# Patient Record
Sex: Male | Born: 1955 | Race: Black or African American | Hispanic: No | Marital: Single | Smoking: Current every day smoker
Health system: Southern US, Community
[De-identification: ages and names within clinical notes are randomized; demographics above are authoritative.]

## PROBLEM LIST (undated history)

## (undated) DIAGNOSIS — G8929 Other chronic pain: Secondary | ICD-10-CM

## (undated) DIAGNOSIS — F4310 Post-traumatic stress disorder, unspecified: Secondary | ICD-10-CM

## (undated) DIAGNOSIS — I10 Essential (primary) hypertension: Secondary | ICD-10-CM

---

## 2020-04-30 ENCOUNTER — Encounter (HOSPITAL_COMMUNITY): Payer: Self-pay

## 2020-04-30 ENCOUNTER — Other Ambulatory Visit: Payer: Self-pay

## 2020-04-30 ENCOUNTER — Emergency Department (HOSPITAL_COMMUNITY): Payer: Medicare Other

## 2020-04-30 ENCOUNTER — Emergency Department (HOSPITAL_COMMUNITY)
Admission: EM | Admit: 2020-04-30 | Discharge: 2020-04-30 | Disposition: A | Discharge status: Home / Self Care | Payer: Medicare Other | Attending: Emergency Medicine | Admitting: Emergency Medicine

## 2020-04-30 DIAGNOSIS — Y92410 Unspecified street and highway as the place of occurrence of the external cause: Secondary | ICD-10-CM | POA: Insufficient documentation

## 2020-04-30 DIAGNOSIS — M542 Cervicalgia: Secondary | ICD-10-CM | POA: Insufficient documentation

## 2020-04-30 DIAGNOSIS — I10 Essential (primary) hypertension: Secondary | ICD-10-CM | POA: Insufficient documentation

## 2020-04-30 DIAGNOSIS — Y9389 Activity, other specified: Secondary | ICD-10-CM | POA: Insufficient documentation

## 2020-04-30 DIAGNOSIS — R519 Headache, unspecified: Secondary | ICD-10-CM | POA: Insufficient documentation

## 2020-04-30 DIAGNOSIS — M549 Dorsalgia, unspecified: Secondary | ICD-10-CM | POA: Diagnosis not present

## 2020-04-30 DIAGNOSIS — F17200 Nicotine dependence, unspecified, uncomplicated: Secondary | ICD-10-CM | POA: Insufficient documentation

## 2020-04-30 DIAGNOSIS — Y999 Unspecified external cause status: Secondary | ICD-10-CM | POA: Insufficient documentation

## 2020-04-30 HISTORY — DX: Other chronic pain: G89.29

## 2020-04-30 HISTORY — DX: Post-traumatic stress disorder, unspecified: F43.10

## 2020-04-30 HISTORY — DX: Essential (primary) hypertension: I10

## 2020-04-30 NOTE — ED Notes (Signed)
Patient verbalizes understanding of discharge instructions . Opportunity for questions and answers were provided . Armband removed by staff ,Pt discharged from ED. W/C  offered at D/C  and Declined W/C at D/C and was escorted to lobby by RN.  
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
## 2020-04-30 NOTE — Discharge Instructions (Signed)
Please return for any problem.  Follow-up with your regular care provider as instructed.  Use ice, Tylenol, and ibuprofen as suggested for pain control.
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
## 2020-04-30 NOTE — ED Notes (Signed)
Pt transported to xray 

## 2020-05-02 ENCOUNTER — Encounter (HOSPITAL_COMMUNITY): Payer: Self-pay

## 2020-05-02 ENCOUNTER — Encounter (HOSPITAL_COMMUNITY): Payer: Self-pay | Admitting: Emergency Medicine

## 2020-05-02 ENCOUNTER — Emergency Department (HOSPITAL_COMMUNITY): Payer: Medicare Other

## 2020-05-02 ENCOUNTER — Emergency Department (HOSPITAL_COMMUNITY)
Admission: EM | Admit: 2020-05-02 | Discharge: 2020-05-02 | Disposition: A | Discharge status: Home / Self Care | Payer: Medicare Other | Attending: Emergency Medicine | Admitting: Emergency Medicine

## 2020-05-02 ENCOUNTER — Emergency Department (HOSPITAL_COMMUNITY)
Admission: EM | Admit: 2020-05-02 | Discharge: 2020-05-03 | Discharge status: Against Medical Advice | Payer: Medicare Other | Source: Home / Self Care | Attending: Emergency Medicine | Admitting: Emergency Medicine

## 2020-05-02 ENCOUNTER — Other Ambulatory Visit: Payer: Self-pay

## 2020-05-02 DIAGNOSIS — R440 Auditory hallucinations: Secondary | ICD-10-CM | POA: Insufficient documentation

## 2020-05-02 DIAGNOSIS — Z20822 Contact with and (suspected) exposure to covid-19: Secondary | ICD-10-CM | POA: Insufficient documentation

## 2020-05-02 DIAGNOSIS — R4182 Altered mental status, unspecified: Secondary | ICD-10-CM | POA: Diagnosis present

## 2020-05-02 DIAGNOSIS — R451 Restlessness and agitation: Secondary | ICD-10-CM

## 2020-05-02 DIAGNOSIS — F17200 Nicotine dependence, unspecified, uncomplicated: Secondary | ICD-10-CM | POA: Insufficient documentation

## 2020-05-02 DIAGNOSIS — F1910 Other psychoactive substance abuse, uncomplicated: Secondary | ICD-10-CM | POA: Insufficient documentation

## 2020-05-02 DIAGNOSIS — F17210 Nicotine dependence, cigarettes, uncomplicated: Secondary | ICD-10-CM | POA: Insufficient documentation

## 2020-05-02 DIAGNOSIS — I10 Essential (primary) hypertension: Secondary | ICD-10-CM | POA: Insufficient documentation

## 2020-05-02 LAB — COMPREHENSIVE METABOLIC PANEL
ALT: 30 U/L (ref 0–44)
AST: 26 U/L (ref 15–41)
Albumin: 4.6 g/dL (ref 3.5–5.0)
Alkaline Phosphatase: 91 U/L (ref 38–126)
Anion gap: 11 (ref 5–15)
BUN: 21 mg/dL (ref 8–23)
CO2: 25 mmol/L (ref 22–32)
Calcium: 9.5 mg/dL (ref 8.9–10.3)
Chloride: 105 mmol/L (ref 98–111)
Creatinine, Ser: 1.41 mg/dL — ABNORMAL HIGH (ref 0.61–1.24)
GFR calc Af Amer: >60 mL/min (ref >60)
GFR calc non Af Amer: 53 mL/min — ABNORMAL LOW (ref >60)
Glucose, Bld: 103 mg/dL — ABNORMAL HIGH (ref 70–99)
Potassium: 3.4 mmol/L — ABNORMAL LOW (ref 3.5–5.1)
Sodium: 141 mmol/L (ref 135–145)
Total Bilirubin: 1 mg/dL (ref 0.3–1.2)
Total Protein: 9 g/dL — ABNORMAL HIGH (ref 6.5–8.1)

## 2020-05-02 LAB — RAPID URINE DRUG SCREEN, HOSP PERFORMED
Amphetamines: POSITIVE — AB
Barbiturates: NOT DETECTED
Benzodiazepines: NOT DETECTED
Cocaine: POSITIVE — AB
Opiates: NOT DETECTED
Tetrahydrocannabinol: POSITIVE — AB

## 2020-05-02 LAB — CBC
HCT: 41.2 % (ref 39.0–52.0)
Hemoglobin: 13.1 g/dL (ref 13.0–17.0)
MCH: 34.2 pg — ABNORMAL HIGH (ref 26.0–34.0)
MCHC: 31.8 g/dL (ref 30.0–36.0)
MCV: 107.6 fL — ABNORMAL HIGH (ref 80.0–100.0)
Platelets: 233 10*3/uL (ref 150–400)
RBC: 3.83 MIL/uL — ABNORMAL LOW (ref 4.22–5.81)
RDW: 13.1 % (ref 11.5–15.5)
WBC: 10.4 10*3/uL (ref 4.0–10.5)
nRBC: 0 % (ref 0.0–0.2)

## 2020-05-02 LAB — SARS CORONAVIRUS 2 BY RT PCR (HOSPITAL ORDER, PERFORMED IN ~~LOC~~ HOSPITAL LAB): SARS Coronavirus 2: NEGATIVE

## 2020-05-02 LAB — ETHANOL: Alcohol, Ethyl (B): <10 mg/dL (ref <10)

## 2020-05-02 MED ORDER — ONDANSETRON HCL 4 MG PO TABS: 4.0000 mg | ORAL_TABLET | Freq: Three times a day (TID) | ORAL | Status: DC | PRN

## 2020-05-02 MED ORDER — ACETAMINOPHEN 325 MG PO TABS: 650.0000 mg | ORAL_TABLET | ORAL | Status: DC | PRN

## 2020-05-02 MED ORDER — HYDROXYZINE HCL 25 MG PO TABS
50.0000 mg | ORAL_TABLET | Freq: Once | ORAL | Status: AC
  Administered 2020-05-02: 50 mg via ORAL
  Filled 2020-05-02: qty 2

## 2020-05-02 MED ORDER — SODIUM CHLORIDE 0.9 % IV BOLUS (SEPSIS)
1000.0000 mL | Freq: Once | INTRAVENOUS | Status: AC
  Administered 2020-05-02: 1000 mL via INTRAVENOUS

## 2020-05-02 MED ORDER — BENZTROPINE MESYLATE 1 MG PO TABS: 1.0000 mg | ORAL_TABLET | Freq: Four times a day (QID) | ORAL | Status: DC | PRN

## 2020-05-02 MED ORDER — SODIUM CHLORIDE 0.9 % IV SOLN
1000.0000 mL | INTRAVENOUS | Status: DC
  Administered 2020-05-02: 1000 mL via INTRAVENOUS

## 2020-05-02 MED ORDER — HALOPERIDOL 5 MG PO TABS: 5.0000 mg | ORAL_TABLET | Freq: Four times a day (QID) | ORAL | Status: DC | PRN
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
## 2020-05-02 NOTE — ED Triage Notes (Signed)
Patient arrived via [facility] but would not answer any questions. Upon arrival, patient states he is here because he does not have his medication and has had a panic attack. Patient cooperative in triage. Patient declines any pain or shortness of breath.
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
## 2020-05-02 NOTE — Discharge Instructions (Addendum)
Please do not use illicit substances including cocaine, thc, or amphetamines.  Please have your blood pressure rechecked this week.

## 2020-05-02 NOTE — ED Provider Notes (Signed)
  Physical Exam  BP (!) 146/78 (BP Location: Right Arm)   Pulse 94   Temp 98 F (36.7 C) (Oral)   Resp 19   SpO2 100%   Physical Exam  ED Course/Procedures   Clinical Course as of May 02 2310  Tue May 02, 2020  2304 Patient stable, medically cleared and continues to wait for tts   [KM]    Clinical Course User Index [KM] Arlyn Dunning, PA-C    Procedures  MDM  Care assumed from Deforest Hoyles PA due ot change of shift, see his notes for full HPI     Jeral Pinch 05/02/20 2311    Vanetta Mulders, MD 05/02/20 2314

## 2020-05-02 NOTE — ED Triage Notes (Signed)
Patient here from lobby reporting that he is not speaking. States that he told the registration what he was here for and he is not repeating it. Seen earlier today.

## 2020-05-02 NOTE — ED Notes (Signed)
Culture sent with UA 

## 2020-05-03 NOTE — ED Notes (Signed)
Pt was unable to be found in room, or anywhere in the department, per security he was seen leaving the ED about 2200pm

## 2022-06-13 IMAGING — CT CT CERVICAL SPINE W/O CM
3 of 4 series · 13 of 35 positions shown, 16 images · non-contrast
Comparison: None.

CLINICAL DATA: Status post trauma.

EXAM:
CT CERVICAL SPINE WITHOUT CONTRAST
TECHNIQUE: Multidetector CT imaging of the cervical spine was performed without
intravenous contrast. Multiplanar CT image reconstructions were also
generated.

[Series 4: c_spine 2.0 st · axial · 0.31mm/px · z∈[-255,-121]mm · 5 of 101 slices shown, 7 images]
[im 17/101  soft-tissue]
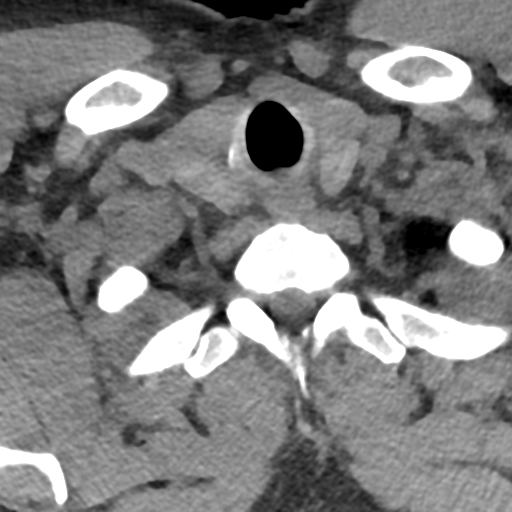
[im 17/101  bone]
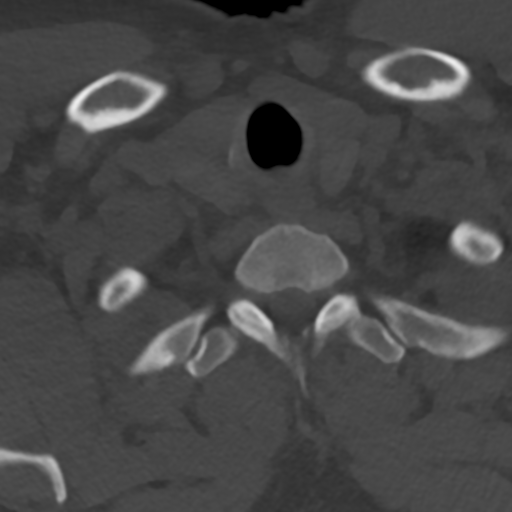
[im 34/101  bone]
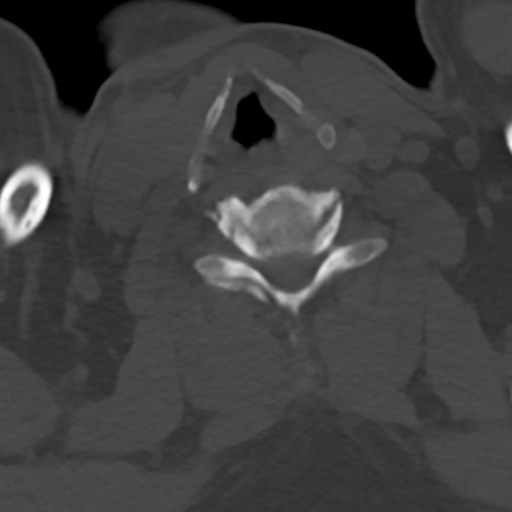
[im 51/101  bone]
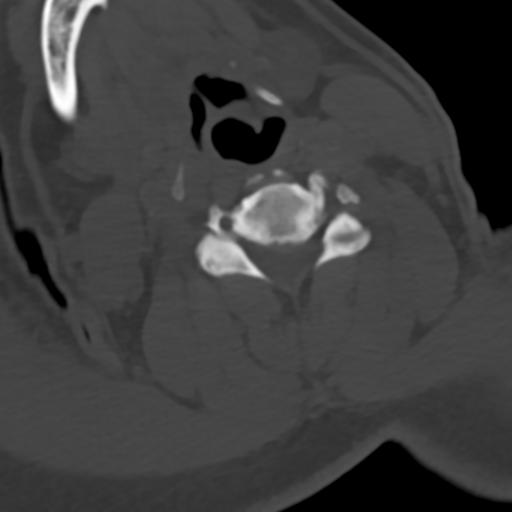
[im 67/101  bone]
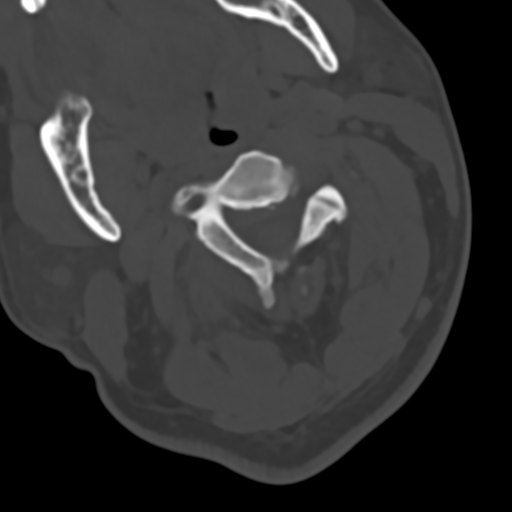
[im 84/101  soft-tissue]
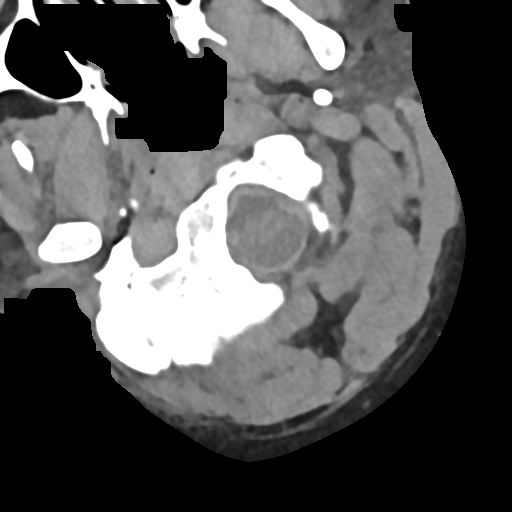
[im 84/101  bone]
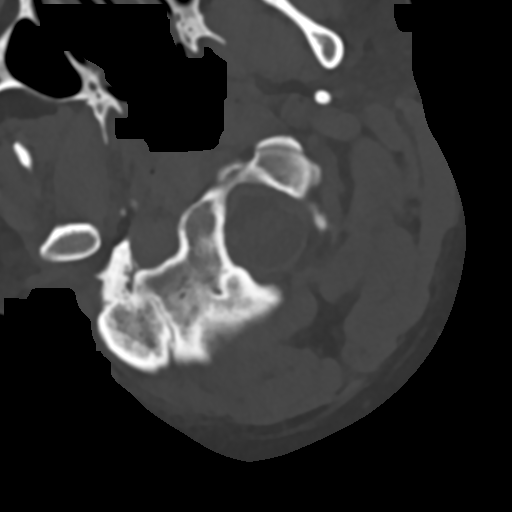

[Series 6: c_spine 2.0 sag bone · sagittal · 0.29mm/px · 5 of 61 slices shown, 6 images]
[im 21/61  bone]
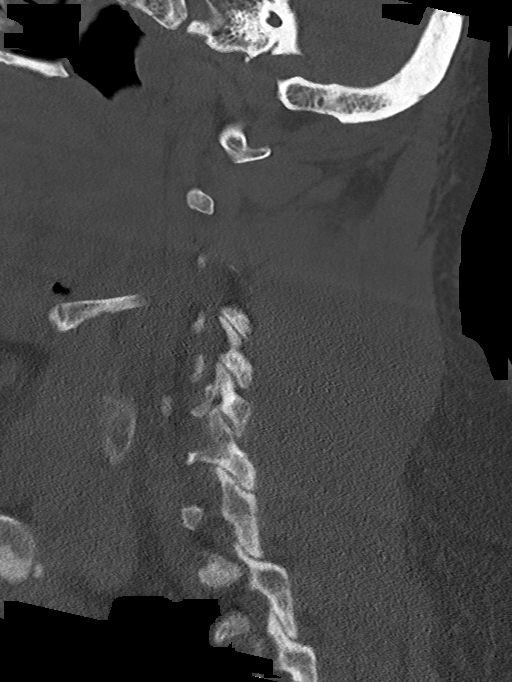
[im 26/61  bone]
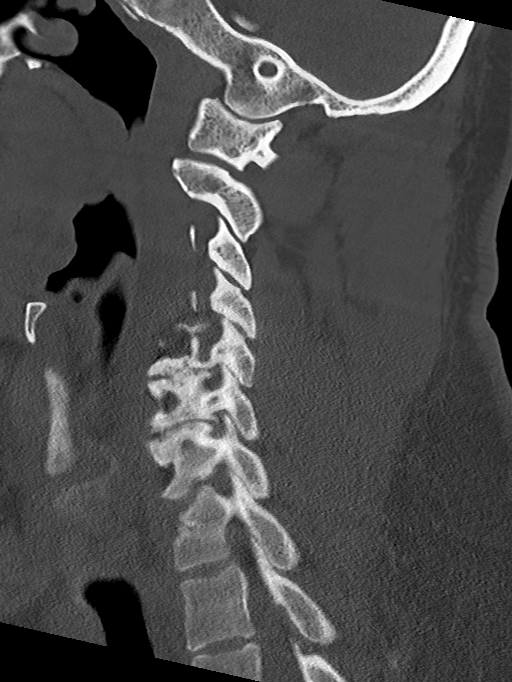
[im 31/61  soft-tissue]
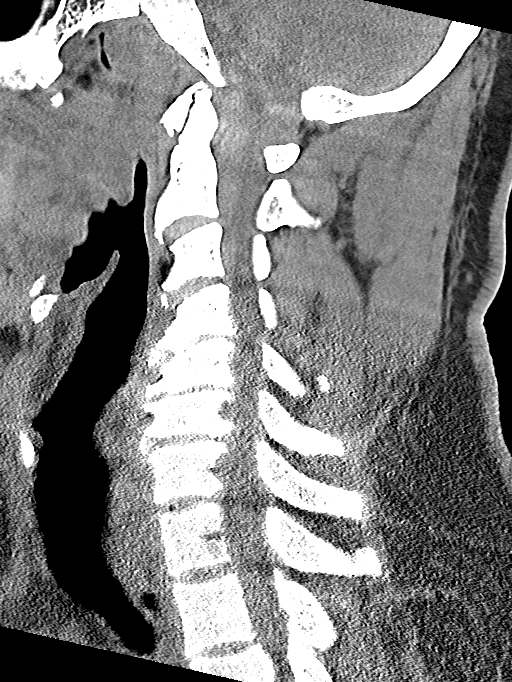
[im 31/61  bone]
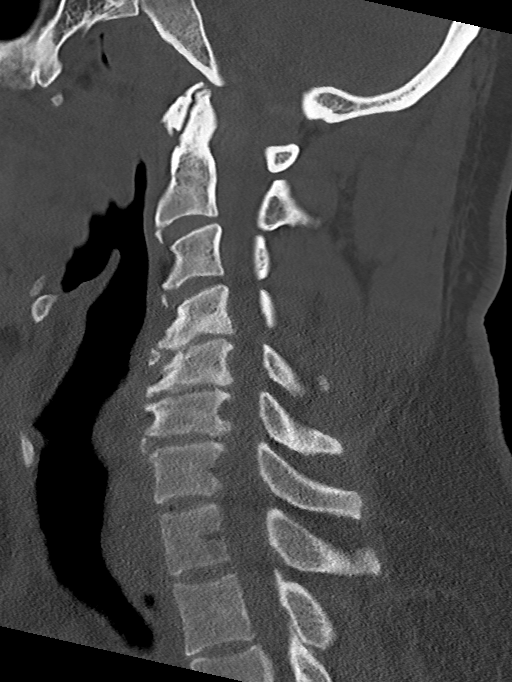
[im 36/61  bone]
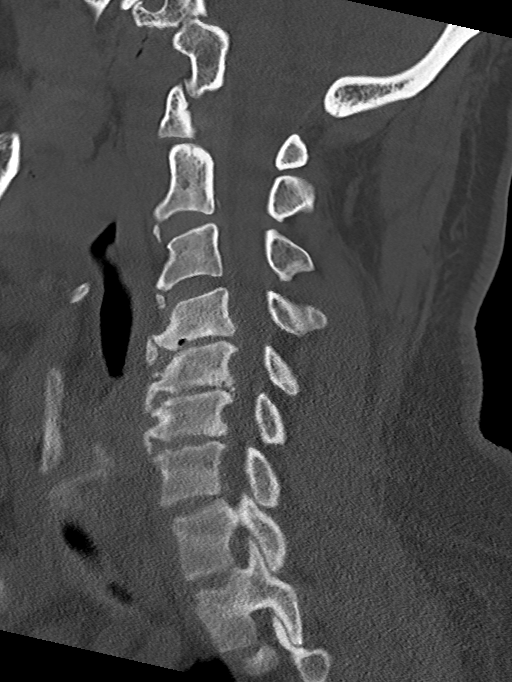
[im 41/61  bone]
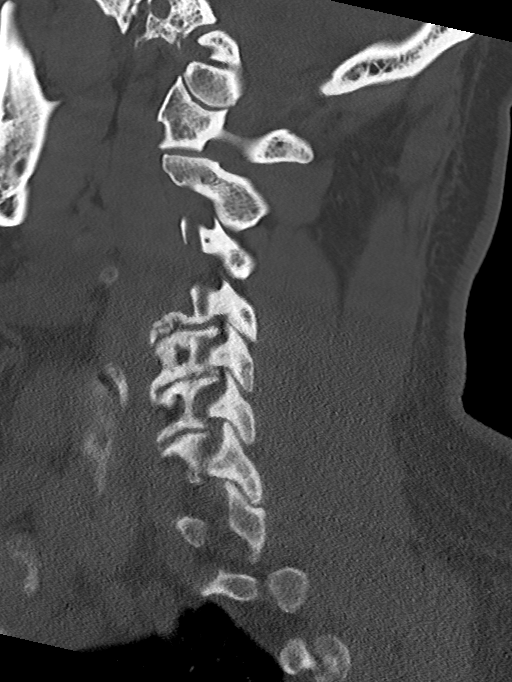

[Series 8: c_spine 2.0 cor bone · coronal · 0.29mm/px · 3 of 60 slices shown]
[im 12/60  bone]
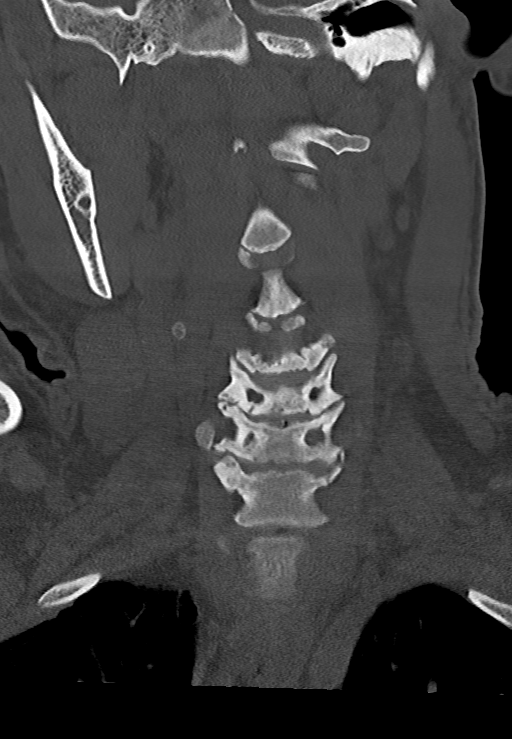
[im 24/60  bone]
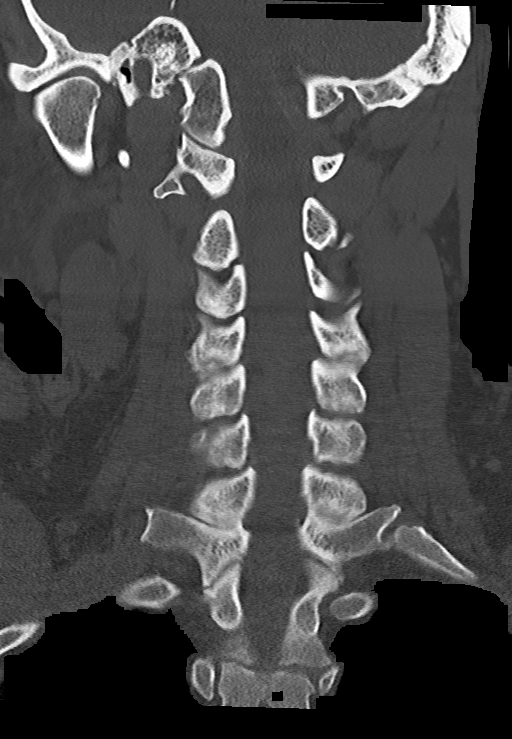
[im 36/60  bone]
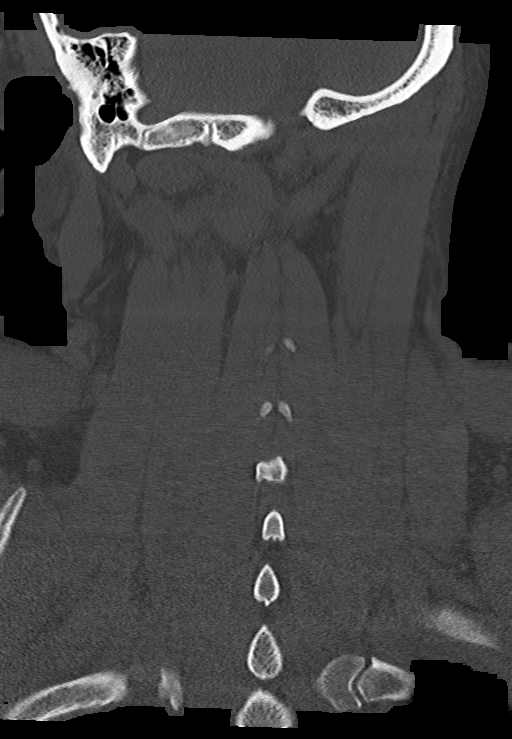

[13 of 35 positions shown; findings below may reference images not displayed]

FINDINGS: Alignment: Normal.

Skull base and vertebrae: No acute fracture. No primary bone lesion
or focal pathologic process.

Soft tissues and spinal canal: No prevertebral fluid or swelling. No
visible canal hematoma.

Disc levels: Moderate to marked severity endplate sclerosis is seen
at the levels of C4-C5, C5-C6 and C6-C7. Mild to moderate severity
intervertebral disc space narrowing is also seen at these levels.

Mild bilateral multilevel facet joint hypertrophy is noted.

Upper chest: Negative.

Other: None.
IMPRESSION: 1. No acute osseous abnormality.
2. Moderate to marked severity degenerative changes at the levels of
C4-C5, C5-C6 and C6-C7.
